# Patient Record
Sex: Male | Born: 1970 | Race: White | Hispanic: No | Marital: Single | Smoking: Current some day smoker
Health system: Southern US, Community
[De-identification: ages and names within clinical notes are randomized; demographics above are authoritative.]

---

## 2007-10-06 ENCOUNTER — Ambulatory Visit: Payer: Self-pay | Admitting: Cardiology

## 2007-10-24 ENCOUNTER — Ambulatory Visit: Payer: Self-pay

## 2007-10-24 ENCOUNTER — Encounter: Payer: Self-pay | Admitting: Cardiology

## 2010-06-02 NOTE — Assessment & Plan Note (Signed)
[facility]                            CARDIOLOGY OFFICE NOTE   Peter Peterson, Peter Peterson                        MRN:          [id number]  DATE:10/06/2007                            DOB:          1970/11/13    Peter Peterson is a 40 year old male who presents for evaluation of chest  pain.  He has no prior cardiac history.  He typically does not have  dyspnea on exertion, orthopnea, PND, pedal edema, palpitations,  presyncope, syncope, or exertional chest pain.  Approximately 3 weeks  ago, the patient states he had an intense stabbing pain in his left  chest area.  It radiated down to his lower back.  The pain was not  pleuritic, positional nor it is exertional.  He wonders whether it may  be related to spicy food.  There was no water brash.  There was no  associated shortness of breath, but there was mild nausea.  There was  mild diaphoresis, but he thinks this may be because he became anxious.  Because of the above, he wanted to be seen.  He is on no medications.  He has no known drug allergies.   SOCIAL HISTORY:  He does smoke.  He consumes 3-4 beers per day.  He has  a history of cocaine use approximately 5-6 years ago, but he does not  use any at present.  He is single.   FAMILY HISTORY:  Positive for coronary artery disease in his father.  His father also had an aneurysm.   PAST MEDICAL HISTORY:  There is no diabetes mellitus, hypertension, or  hyperlipidemia.  He has had previous surgery for nasal deviated septum.  He has also had surgery for a lazy eye on the left.  He has no other  medical problems.   REVIEW OF SYSTEMS:  He denies any headaches, fevers, or chills.  There  is no productive cough or hemoptysis.  There is no dysphagia,  odynophagia, melena, or hematochezia.  There is no dysuria or hematuria.  There is no rash or seizure activity.  There is no orthopnea, PND, or  pedal edema.  There is no claudication.  The remaining systems are  negative.   PHYSICAL EXAMINATION:  VITAL SIGNS:  Today, blood pressure of 110/76 and  his pulse is 86.  He weighs 157 pounds.  GENERAL:  He is well developed and well nourished, in no acute distress.  He does not appear to be depressed.  There is no peripheral clubbing.  SKIN:  Warm and dry.  BACK:  Normal.  HEENT:  Normal eyelids.  NECK:  Supple with a normal upstroke bilaterally.  No bruits noted.  There is no jugular distention.  I cannot appreciate thyromegaly.  CHEST:  Clear to auscultation.  No expansion.  CARDIOVASCULAR:  Regular rhythm.  Normal S1 and S2.  There are no  murmurs, rubs, or gallops noted.  ABDOMEN:  Nontender and nondistended.  Positive bowel sounds.  No  hepatosplenomegaly.  No mass appreciated.  There is no abdominal bruit.  He has 2+ femoral pulses bilaterally.  No bruits.  EXTREMITIES:  No edema noted and I can palpate no cords.  He has 2+  posterior tibial pulses bilaterally.  NEUROLOGIC:  Grossly intact.   His electrocardiogram today shows a sinus rhythm at a rate of 73.  The  axis is normal.  There are no significant ST changes noted.  There is an  RV conduction delay.   DIAGNOSES:  1. Chest pain - his symptoms are atypical and etiology is unclear to      me.  We will plan to proceed with a stress echocardiogram to      further evaluate.  If it is normal, we will not pursue further      ischemia evaluation.  2. Family history of aneurysm - the patient is concerned about the      possibility of this.  I have had long discussions about      discontinuing his tobacco use which is a strongest risk factor.  I      have recommended that he obtain an abdominal ultrasound at      approximately the age 28.  Note, I do not feel any pulsatile masses      on examination.  We will also schedule him to have an      echocardiogram to rule out thoracic aortic aneurysm.  3. Tobacco abuse - we discussed the importance of discontinuing this      for between 3-10  minutes.   We will see him back on an as needed basis pending the results of his  stress echocardiogram.     Madolyn Frieze. Jens Som, MD, [facility]  Electronically Signed    BSC/MedQ  DD: 10/06/2007  DT: 10/07/2007  Job #: [id number]

## 2013-09-19 ENCOUNTER — Other Ambulatory Visit: Payer: Self-pay | Admitting: *Deleted

## 2013-09-19 DIAGNOSIS — R002 Palpitations: Secondary | ICD-10-CM

## 2013-09-21 ENCOUNTER — Encounter: Payer: Self-pay | Admitting: Radiology

## 2013-09-21 ENCOUNTER — Encounter (INDEPENDENT_AMBULATORY_CARE_PROVIDER_SITE_OTHER): Payer: BC Managed Care – PPO

## 2013-09-21 DIAGNOSIS — R002 Palpitations: Secondary | ICD-10-CM

## 2013-09-21 NOTE — Progress Notes (Signed)
Patient ID: Peter Peterson, male   DOB: November 08, 1970, 43 y.o.   MRN: [id number] E cardio 24 hr holter applied

## 2015-09-15 ENCOUNTER — Ambulatory Visit (INDEPENDENT_AMBULATORY_CARE_PROVIDER_SITE_OTHER): Payer: BC Managed Care – PPO | Admitting: Sports Medicine

## 2015-09-15 ENCOUNTER — Ambulatory Visit (INDEPENDENT_AMBULATORY_CARE_PROVIDER_SITE_OTHER): Payer: BC Managed Care – PPO

## 2015-09-15 ENCOUNTER — Encounter: Payer: Self-pay | Admitting: Sports Medicine

## 2015-09-15 DIAGNOSIS — M79671 Pain in right foot: Secondary | ICD-10-CM

## 2015-09-15 DIAGNOSIS — M795 Residual foreign body in soft tissue: Secondary | ICD-10-CM | POA: Diagnosis not present

## 2015-09-15 NOTE — Patient Instructions (Signed)
Soak using 1/4 cup of epsom salt and basin of warm water for 15 mins. Dry well and neosporin and bandaid x 1 week

## 2015-09-15 NOTE — Progress Notes (Signed)
Subjective: Peter Peterson is a 45 y.o. male patient who presents to office for evaluation of Right foot pain secondary to callus skin x 6 months, remembered stepping on glass and states that it feels like something is still in the skin. Patient complains of pain at the lesion present Right plantar foot. Patient has tried padding with no relief in symptoms. Patient denies any other pedal complaints.   There are no active problems to display for this patient.   No current outpatient prescriptions on file prior to visit.   No current facility-administered medications on file prior to visit.     No Known Allergies  Objective:  General: Alert and oriented x3 in no acute distress  Dermatology: Keratotic lesion present right plantar foot with skin lines transversing the lesion, pain is present with direct pressure to the lesion with a central nucleated core noted once parred no obvious glass however possible small fragment, no webspace macerations, no ecchymosis bilateral, all nails x 10 are well manicured.  Vascular: Dorsalis Pedis and Posterior Tibial pedal pulses 2/4, Capillary Fill Time 3 seconds, + pedal hair growth bilateral, no edema bilateral lower extremities, Temperature gradient within normal limits.  Neurology: Peter Peterson sensation intact via light touch bilateral.  Musculoskeletal: Mild tenderness with palpation at the keratotic lesion site on Right, Muscular strength 5/5 in all groups without pain or limitation on range of motion. Asymptomatic bunion and hammertoe boney deformity noted.  Xray, Right foot: Normal osseous mineralization, bunion, hammertoe, midtarsal breach, no foreign body on xray.  Assessment and Plan: Problem List Items Addressed This Visit    None    Visit Diagnoses    Right foot pain    -  Primary   Relevant Orders   DG Foot 2 Views Right   Residual foreign body in soft tissue       possible      -Complete examination performed -Discussed treatment options  for possible foreign body vs porokeratosis -Parred keratoic lesion using a chisel blade and excised hard core to healthy bleeding base removing possible foreign body; treated the area with antibiotic cream and offloading pad with bandaid -Advised soaking with Epsom salt and bandaid x 1 week with pads as needed -Advised good supportive shoes and inserts -Patient to return to office in 2 weeks, recheck of site/as needed or sooner if condition worsens.  Peter Peterson, DPM

## 2015-09-30 ENCOUNTER — Ambulatory Visit: Payer: BC Managed Care – PPO | Admitting: Sports Medicine

## 2016-11-23 ENCOUNTER — Ambulatory Visit: Payer: BC Managed Care – PPO | Admitting: Sports Medicine

## 2016-11-23 ENCOUNTER — Encounter: Payer: Self-pay | Admitting: Sports Medicine

## 2016-11-23 DIAGNOSIS — B070 Plantar wart: Secondary | ICD-10-CM

## 2016-11-23 DIAGNOSIS — M79672 Pain in left foot: Secondary | ICD-10-CM

## 2016-11-23 NOTE — Progress Notes (Signed)
  Subjective: Iona HansenSamuel Treanor is a 46 y.o. male patient who presents to office for evaluation of Left foot pain secondary to painful wart at the plantar forefoot and heel. Patient has tried pad with no relief in symptoms. Patient denies any other pedal complaints.   There are no active problems to display for this patient.   No current outpatient medications on file prior to visit.   No current facility-administered medications on file prior to visit.     No Known Allergies  Objective:  General: Alert and oriented x3 in no acute distress  Dermatology: Keratotic lesion present measuring <0.5cm at plantar forefoot and heel on left with no skin lines transversing the lesion and a central core, pain is present with medial lateral pressure to the lesion, no capillaries with pin point bleeding noted, no webspace macerations, no ecchymosis bilateral, all nails x 10 are well manicured.  Vascular: Dorsalis Pedis and Posterior Tibial pedal pulses 1/4, Capillary Fill Time 3 seconds, + pedal hair growth bilateral, no edema bilateral lower extremities, Temperature gradient within normal limits.  Neurology: Gross sensation intact via light touch bilateral.  Musculoskeletal: Mild tenderness with palpation at the lesion site on Left, Muscular strength 5/5 in all groups without pain or limitation on range of motion. + fat pad displacement. No lower extremity muscular or boney deformity noted.  Assessment and Plan: Problem List Items Addressed This Visit    None    Visit Diagnoses    Plantar wart    -  Primary   Left foot pain         -Complete examination performed -Discussed treatment options for wart -Parred keratoic warty lesions using a chisel blade; treated the area with Catharidin covered with bandaid; Advised patient of blistering reaction that will occur from application of medication and once this happens replace bandaid with neosporin and tape/bandaid -Patient to return to office in 1  month or as needed or sooner if condition worsens.  Asencion Islamitorya Naisha Wisdom, DPM

## 2020-08-04 ENCOUNTER — Other Ambulatory Visit: Payer: Self-pay | Admitting: Internal Medicine

## 2020-08-04 DIAGNOSIS — F17200 Nicotine dependence, unspecified, uncomplicated: Secondary | ICD-10-CM

## 2020-08-22 ENCOUNTER — Ambulatory Visit
Admission: RE | Admit: 2020-08-22 | Discharge: 2020-08-22 | Disposition: A | Discharge status: Home / Self Care | Payer: BC Managed Care – PPO | Source: Ambulatory Visit | Attending: Internal Medicine | Admitting: Internal Medicine

## 2020-08-22 ENCOUNTER — Other Ambulatory Visit: Payer: Self-pay

## 2020-08-22 DIAGNOSIS — F17200 Nicotine dependence, unspecified, uncomplicated: Secondary | ICD-10-CM

## 2021-09-08 ENCOUNTER — Other Ambulatory Visit: Payer: Self-pay | Admitting: Internal Medicine

## 2021-09-08 DIAGNOSIS — F17210 Nicotine dependence, cigarettes, uncomplicated: Secondary | ICD-10-CM

## 2022-08-19 IMAGING — CT CT CHEST LUNG CANCER SCREENING LOW DOSE W/O CM
1 series · 10 of 10 positions shown, 13 images · non-contrast
Comparison: 02/07/2012 chest radiograph

CLINICAL DATA: Thirty-three pack-year smoking history. Current
smoker.

EXAM:
CT CHEST WITHOUT CONTRAST LOW-DOSE FOR LUNG CANCER SCREENING
TECHNIQUE: Multidetector CT imaging of the chest was performed following the
standard protocol without IV contrast.

[ct lung segmentation data · axial · 0.78mm/px · z∈[-394,-394]mm · 10 of 361 frames shown]
[frame 1/361  mediastinal]
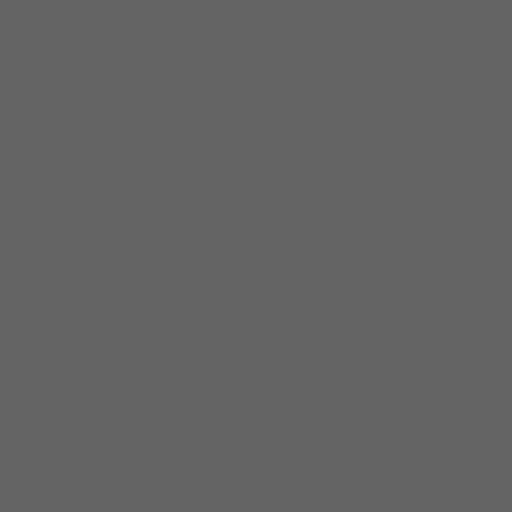
[frame 1/361  lung]
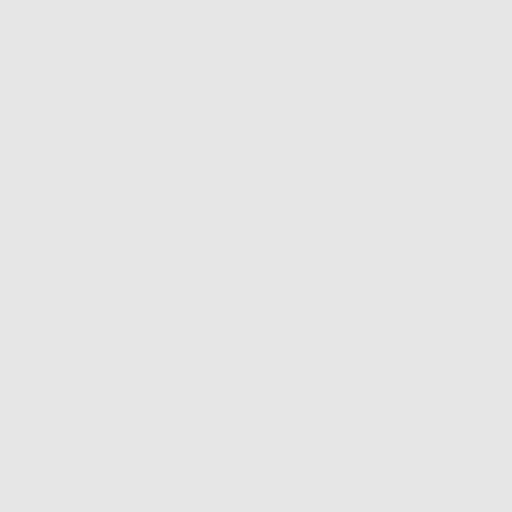
[frame 41/361  lung]
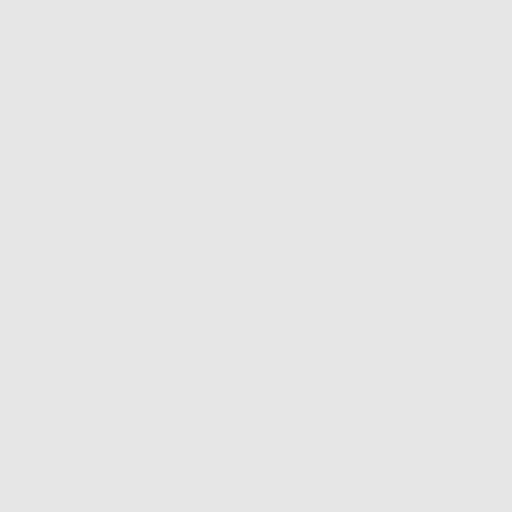
[frame 81/361  lung]
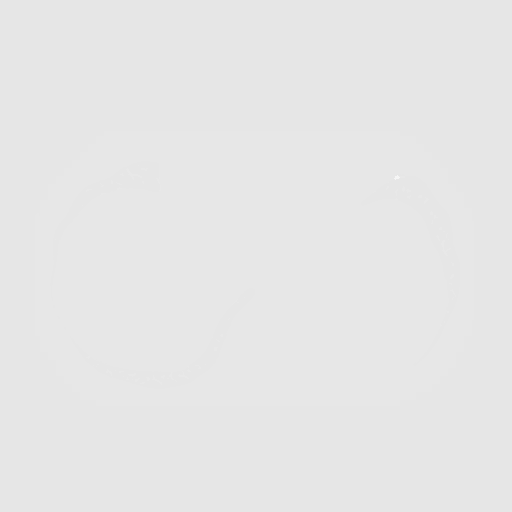
[frame 121/361  lung]
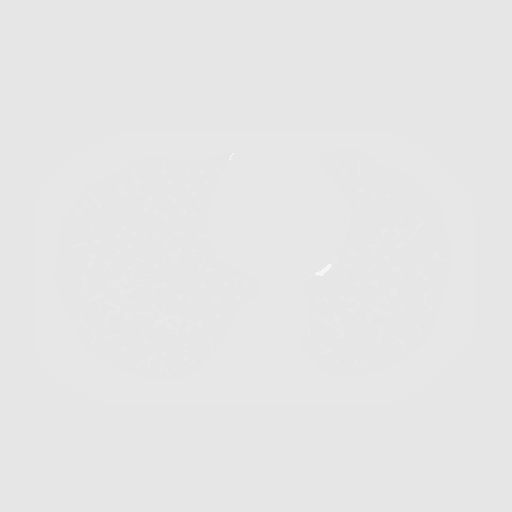
[frame 161/361  mediastinal]
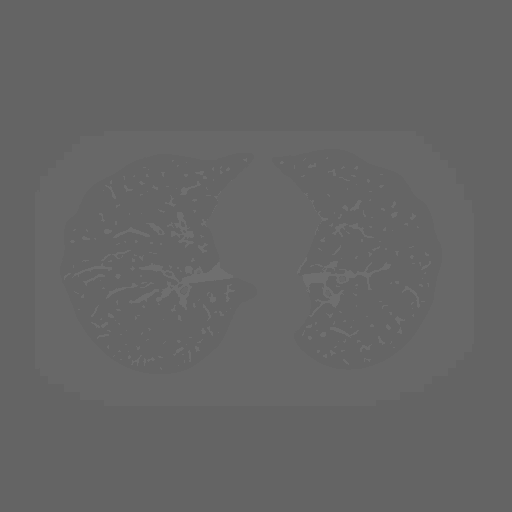
[frame 161/361  lung]
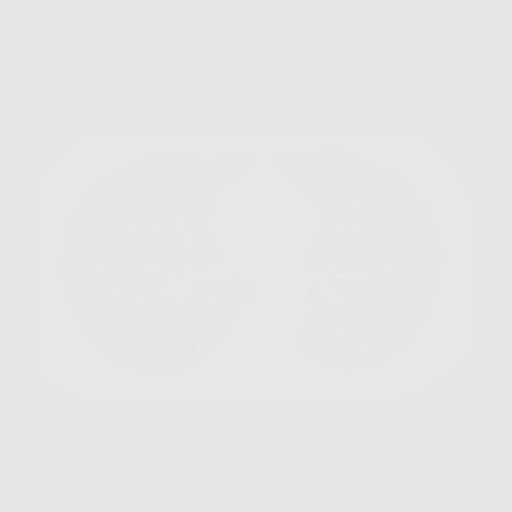
[frame 201/361  lung]
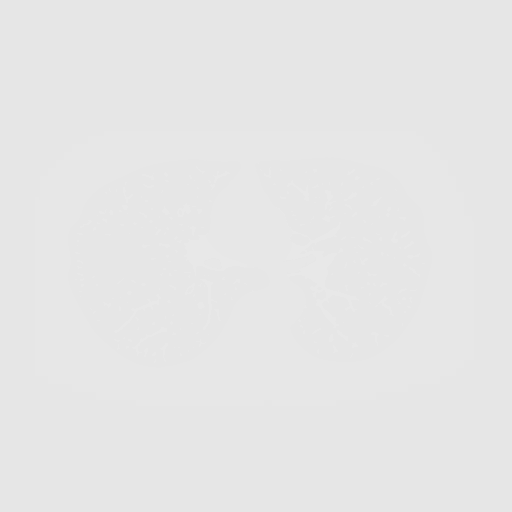
[frame 241/361  lung]
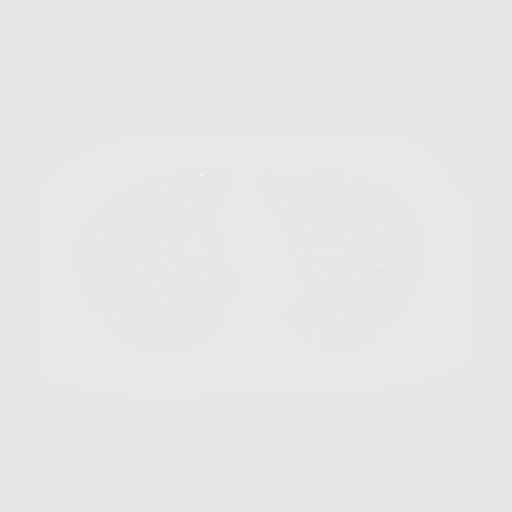
[frame 281/361  lung]
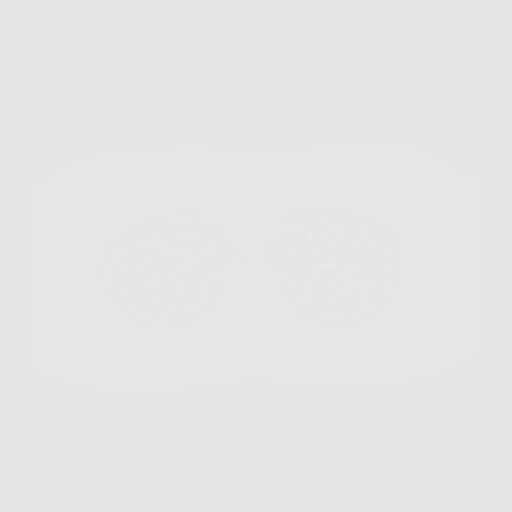
[frame 321/361  mediastinal]
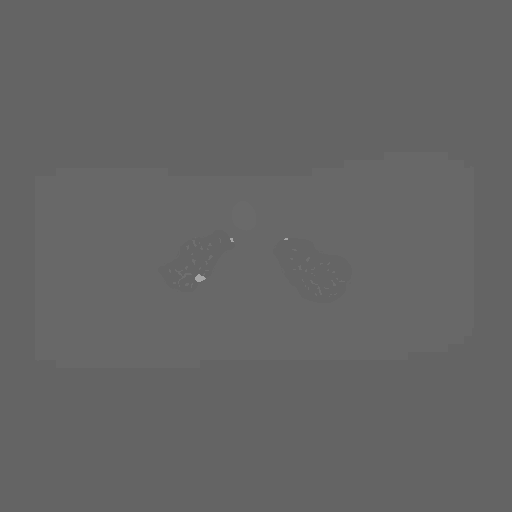
[frame 321/361  lung]
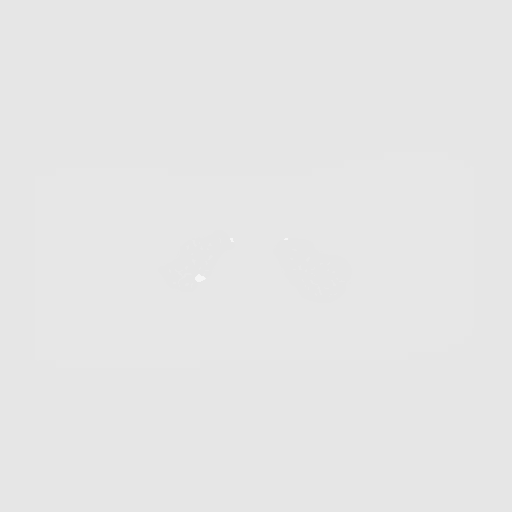
[frame 361/361  lung]
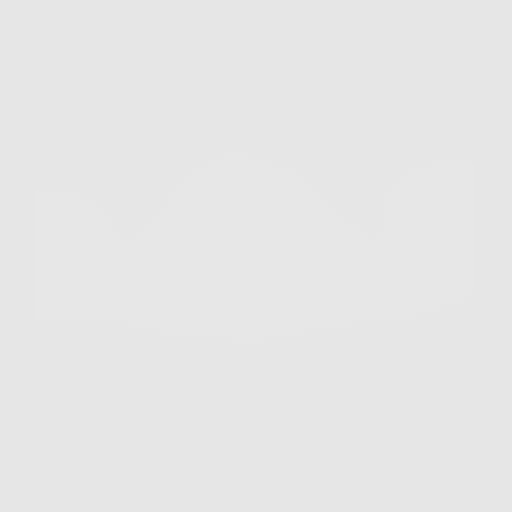

[10 of 10 positions shown; findings below may reference images not displayed]

FINDINGS: Cardiovascular: Normal aortic caliber. Normal heart size, without
pericardial effusion.

Mediastinum/Nodes: No mediastinal or definite hilar adenopathy,
given limitations of unenhanced CT.

Lungs/Pleura: No pleural fluid. Mild centrilobular and moderate
paraseptal emphysema. Presumed secretions in the left side of the
trachea. No suspicious pulmonary nodule or mass.

Upper Abdomen: Normal imaged portions of the liver, spleen, stomach,
pancreas, gallbladder, adrenal glands, kidneys.

Musculoskeletal: Moderate bilateral gynecomastia. No acute osseous
abnormality.
IMPRESSION: 1. Lung-RADS 1, negative. Continue annual screening with low-dose
chest CT without contrast in 12 months.
2. Bilateral gynecomastia.
3. Emphysema ([id number]).
